# Patient Record
Sex: Female | Born: 1990 | Race: White | Hispanic: No | Marital: Single | Smoking: Never smoker
Health system: Southern US, Community
[De-identification: ages and names within clinical notes are randomized; demographics above are authoritative.]

---

## 2018-11-26 ENCOUNTER — Ambulatory Visit (HOSPITAL_COMMUNITY)
Admission: EM | Admit: 2018-11-26 | Discharge: 2018-11-26 | Disposition: A | Discharge status: Home / Self Care | Payer: 59 | Attending: Internal Medicine | Admitting: Internal Medicine

## 2018-11-26 ENCOUNTER — Encounter (HOSPITAL_COMMUNITY): Payer: Self-pay

## 2018-11-26 ENCOUNTER — Ambulatory Visit (INDEPENDENT_AMBULATORY_CARE_PROVIDER_SITE_OTHER): Payer: 59

## 2018-11-26 ENCOUNTER — Other Ambulatory Visit: Payer: Self-pay

## 2018-11-26 DIAGNOSIS — S93401A Sprain of unspecified ligament of right ankle, initial encounter: Secondary | ICD-10-CM

## 2018-11-26 DIAGNOSIS — W19XXXA Unspecified fall, initial encounter: Secondary | ICD-10-CM | POA: Diagnosis not present

## 2018-11-26 MED ORDER — ACETAMINOPHEN 325 MG PO TABS: 650.0000 mg | ORAL_TABLET | Freq: Four times a day (QID) | ORAL | Status: AC | PRN

## 2018-11-26 MED ORDER — IBUPROFEN 400 MG PO TABS: 400.0000 mg | ORAL_TABLET | Freq: Four times a day (QID) | ORAL | 0 refills | Status: DC | PRN

## 2018-11-26 NOTE — ED Triage Notes (Signed)
Pt state she walking along and twisted her right ankle.and scraped up her left knee she tripped over a tree root. This  happened around 3 :30 pm today.

## 2018-11-27 NOTE — ED Provider Notes (Signed)
[facility]    CSN: [id number] Arrival date & time: 11/26/18  1529     History   Chief Complaint Chief Complaint  Patient presents with  . Ankle Pain    HPI Chane Gerrits is a 28 y.o. female with no past medical history comes to the urgent care department on account of acute onset of right ankle sprain.  Patient was in her usual state of health and apparently tripped into sudden onset right ankle pain and swelling.  Patient was unable to bear weight on that leg.  HPI  History reviewed. No pertinent past medical history.  There are no active problems to display for this patient.   History reviewed. No pertinent surgical history.  OB History   No obstetric history on file.      Home Medications    Prior to Admission medications   Medication Sig Start Date End Date Taking? Authorizing Provider  acetaminophen (TYLENOL) 325 MG tablet Take 2 tablets (650 mg total) by mouth every 6 (six) hours as needed. 11/26/18   [place] Landgrebe, Britta Mccreedy, MD    Family History No family history on file.  Social History Social History   Tobacco Use  . Smoking status: Never Smoker  . Smokeless tobacco: Never Used  Substance Use Topics  . Alcohol use: Yes  . Drug use: Never     Allergies   Patient has no known allergies.   Review of Systems Review of Systems  Constitutional: Positive for activity change. Negative for fatigue.  Respiratory: Negative.   Cardiovascular: Negative for chest pain.  Endocrine: Negative.  Negative for cold intolerance and heat intolerance.  Musculoskeletal: Positive for arthralgias, gait problem, joint swelling and myalgias.  Skin: Negative for pallor and rash.  Neurological: Negative for dizziness, light-headedness and headaches.     Physical Exam Triage Vital Signs ED Triage Vitals  Enc Vitals Group     BP 11/26/18 1653 105/69     Pulse Rate 11/26/18 1653 (!) 104     Resp 11/26/18 1653 16     Temp 11/26/18 1653 98.3 F (36.8 C)     Temp  Source 11/26/18 1653 Oral     SpO2 11/26/18 1653 97 %     Weight 11/26/18 1656 212 lb (96.2 kg)     Height --      Head Circumference --      Peak Flow --      Pain Score 11/26/18 1654 7     Pain Loc --      Pain Edu? --      Excl. in GC? --    No data found.  Updated Vital Signs BP 105/69 (BP Location: Right Arm)   Pulse (!) 104   Temp 98.3 F (36.8 C) (Oral)   Resp 16   Wt 96.2 kg   SpO2 97%   Visual Acuity Right Eye Distance:   Left Eye Distance:   Bilateral Distance:    Right Eye Near:   Left Eye Near:    Bilateral Near:     Physical Exam Constitutional:      Appearance: Normal appearance.  Cardiovascular:     Rate and Rhythm: Normal rate and regular rhythm.  Musculoskeletal:        General: Swelling and tenderness present.     Comments: Right ankle swelling over the lateral malleolus.  Point tenderness over the lateral malleolus.  Decreased range of motion particularly inversion and eversion.  Dorsiflexion and plantarflexion are intact with mild discomfort  Skin:    General: Skin is warm and dry.     Findings: No bruising or erythema.  Neurological:     Mental Status: She is alert.      UC Treatments / Results  Labs (all labs ordered are listed, but only abnormal results are displayed) Labs Reviewed - No data to display  EKG None  Radiology Dg Ankle Complete Right  Result Date: 11/26/2018 CLINICAL DATA:  28 year old female status post trip and rolled right ankle today. Lateral pain. EXAM: RIGHT ANKLE - COMPLETE 3+ VIEW COMPARISON:  None. FINDINGS: Moderate to severe soft tissue swelling at the lateral ankle. Underlying normal bone mineralization (probable benign bone island of the medial malleolus). Mortise joint alignment preserved. Talar dome intact. No fracture of the distal tibia or fibula. The calcaneus and other visible bones of the right foot appear intact. Possible right ankle joint effusion on the lateral view. IMPRESSION: Lateral soft tissue  swelling and possible joint effusion but no fracture or dislocation about the right ankle. Electronically Signed   By: Odessa Fleming M.D.   On: 11/26/2018 17:41    Procedures Procedures (including critical care time)  Medications Ordered in UC Medications - No data to display  Initial Impression / Assessment and Plan / UC Course  I have reviewed the triage vital signs and the nursing notes.  Pertinent labs & imaging results that were available during my care of the patient were reviewed by me and considered in my medical decision making (see chart for details).     1.  Right ankle sprain following a mechanical fall: X-ray of the right ankle is negative for fracture but consistent with swelling over the lateral malleolus: Cam boot prescribed for patient Tylenol/NSAIDs for pain Elevation, icing of the ankle recommended. Graded increase in activity as joint pain and swelling improves.  Final Clinical Impressions(s) / UC Diagnoses   Final diagnoses:  Sprain of right ankle, unspecified ligament, initial encounter   Discharge Instructions   None    ED Prescriptions    Medication Sig Dispense Auth. Provider   ibuprofen (ADVIL,MOTRIN) 400 MG tablet  (Status: Discontinued) Take 1 tablet (400 mg total) by mouth every 6 (six) hours as needed. 30 tablet Mckinley Adelstein, Britta Mccreedy, MD   acetaminophen (TYLENOL) 325 MG tablet Take 2 tablets (650 mg total) by mouth every 6 (six) hours as needed.  Merrilee Jansky, MD     Controlled Substance Prescriptions [place] Controlled Substance Registry consulted? No   Merrilee Jansky, MD 11/27/18 [phone]

## 2019-06-16 IMAGING — DX DG ANKLE COMPLETE 3+V*R*
3 series · 3 of 3 positions shown · non-contrast
Comparison: None.

CLINICAL DATA: 27-year-old female status post trip and rolled right
ankle today. Lateral pain.

EXAM:
RIGHT ANKLE - COMPLETE 3+ VIEW

[ankle ap]
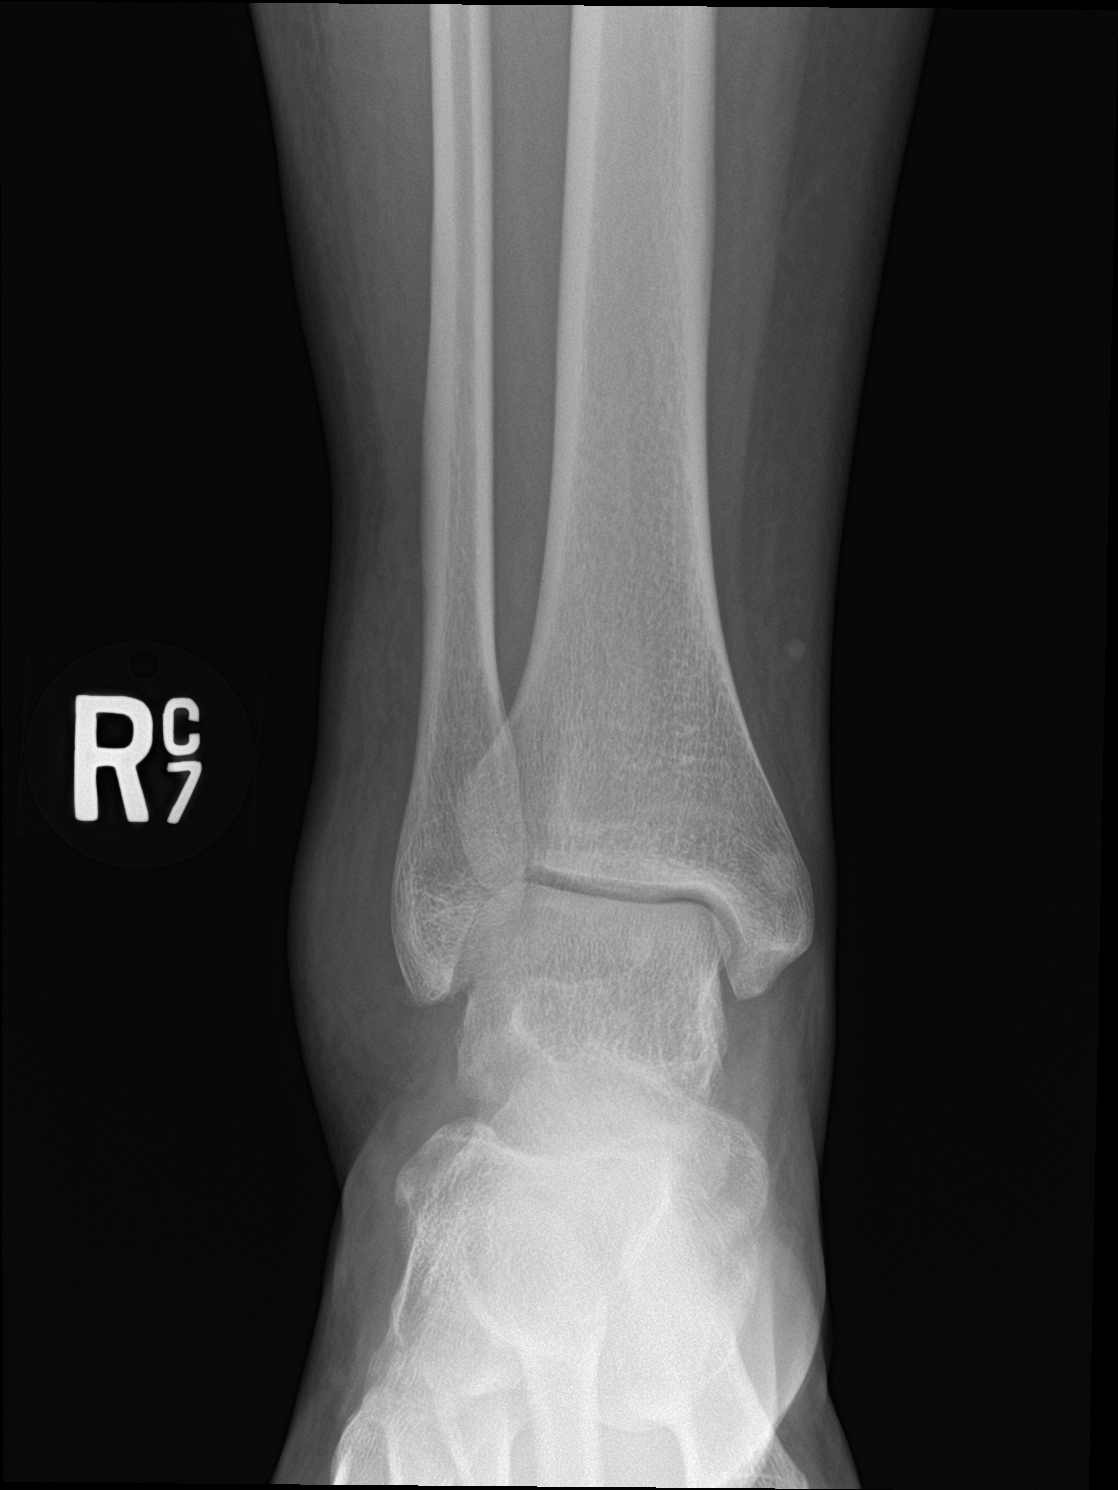

[ankle obl]
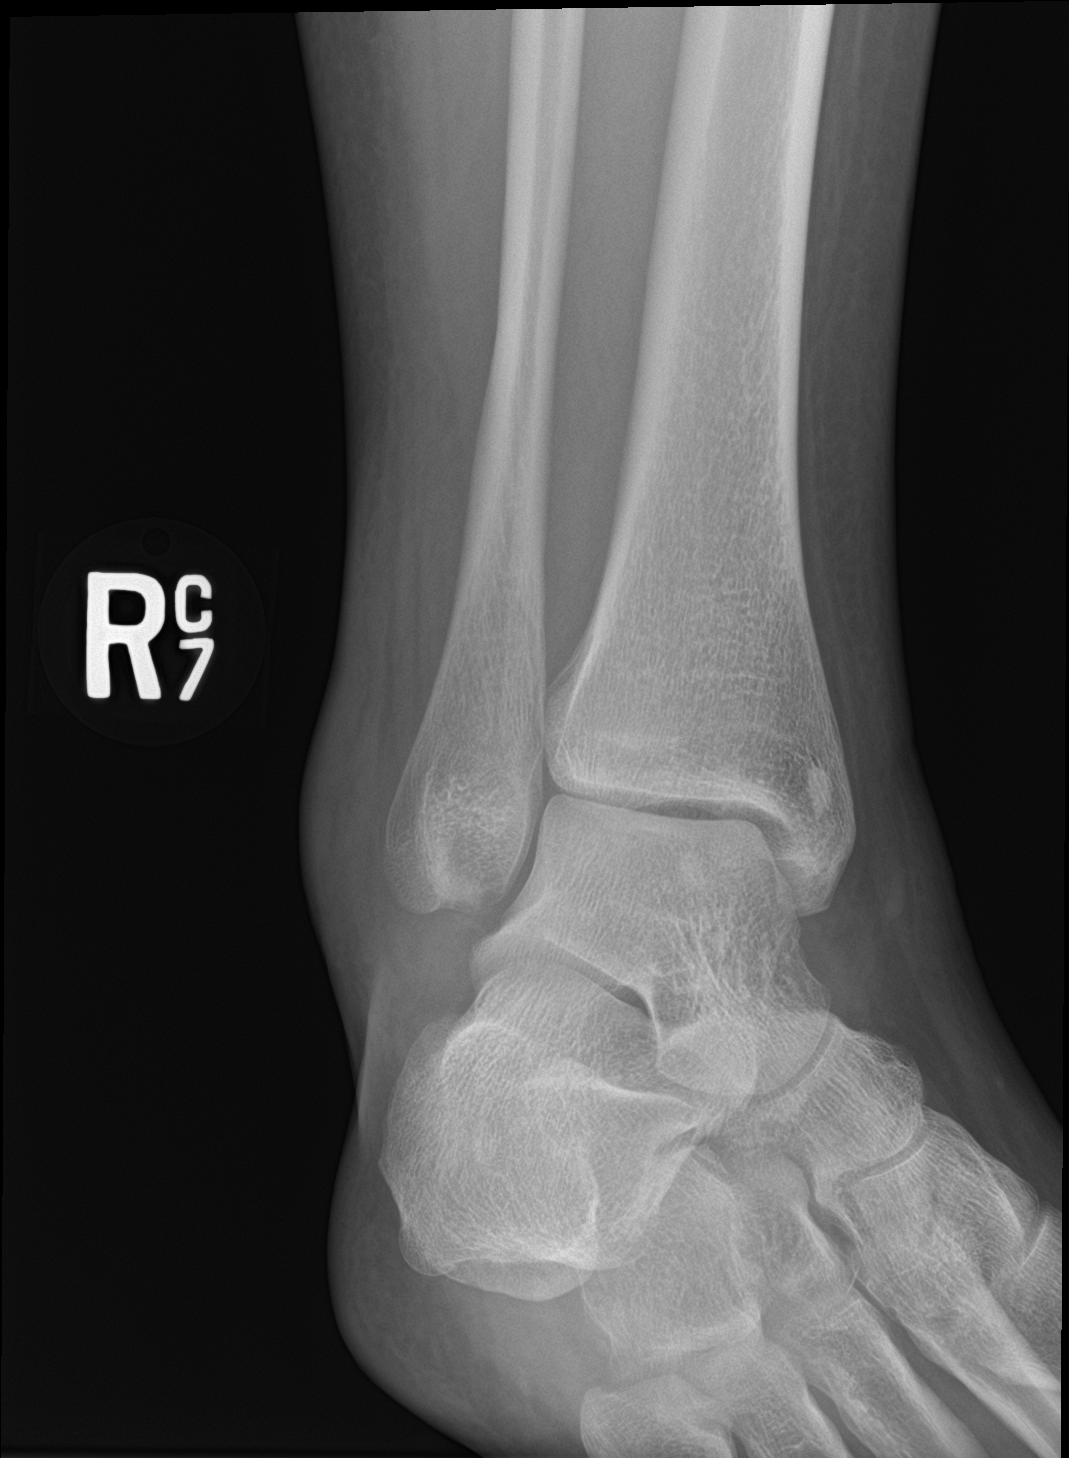

[ankle lat]
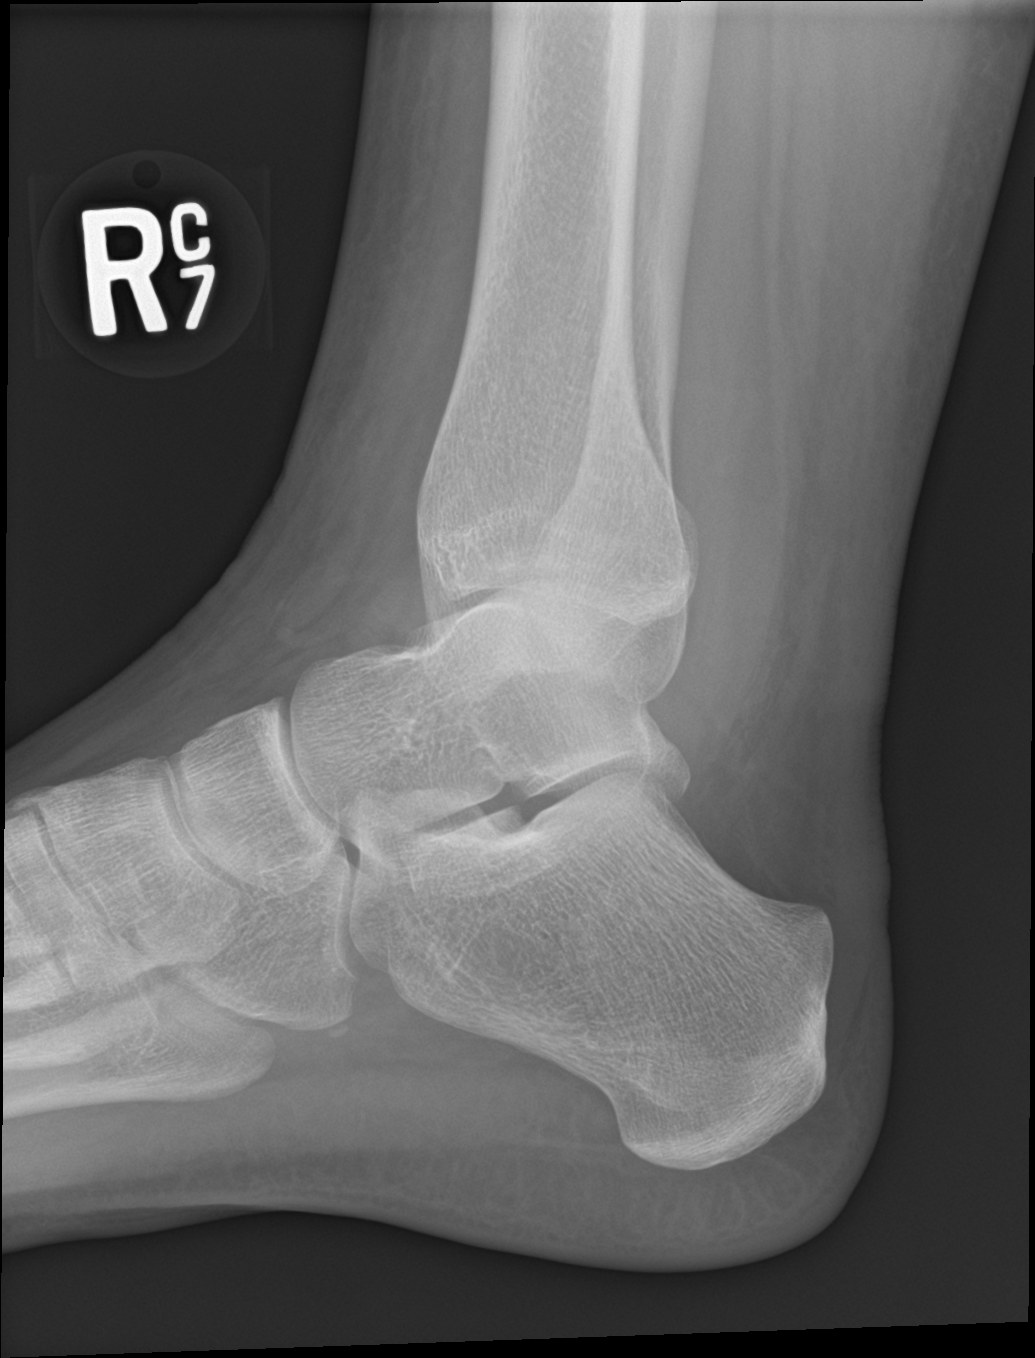

[3 of 3 positions shown; findings below may reference images not displayed]

FINDINGS: Moderate to severe soft tissue swelling at the lateral ankle.
Underlying normal bone mineralization (probable benign bone island
of the medial malleolus). Mortise joint alignment preserved. Talar
dome intact. No fracture of the distal tibia or fibula. The
calcaneus and other visible bones of the right foot appear intact.
Possible right ankle joint effusion on the lateral view.
IMPRESSION: Lateral soft tissue swelling and possible joint effusion but no
fracture or dislocation about the right ankle.
